# Patient Record
Sex: Female | Born: 1980 | Race: White | Hispanic: No | Marital: Married | Smoking: Never smoker
Health system: Southern US, Community
[De-identification: ages and names within clinical notes are randomized; demographics above are authoritative.]

## PROBLEM LIST (undated history)

## (undated) DIAGNOSIS — J309 Allergic rhinitis, unspecified: Secondary | ICD-10-CM

## (undated) HISTORY — DX: Allergic rhinitis, unspecified: J30.9

## (undated) HISTORY — PX: TONSILLECTOMY: SUR1361

## (undated) HISTORY — PX: PARTIAL HYSTERECTOMY: SHX80

## (undated) HISTORY — PX: CHOLECYSTECTOMY: SHX55

---

## 2013-02-10 DIAGNOSIS — G5600 Carpal tunnel syndrome, unspecified upper limb: Secondary | ICD-10-CM | POA: Insufficient documentation

## 2013-08-31 DIAGNOSIS — J3501 Chronic tonsillitis: Secondary | ICD-10-CM | POA: Insufficient documentation

## 2015-08-28 DIAGNOSIS — B070 Plantar wart: Secondary | ICD-10-CM | POA: Insufficient documentation

## 2016-04-20 DIAGNOSIS — S62639A Displaced fracture of distal phalanx of unspecified finger, initial encounter for closed fracture: Secondary | ICD-10-CM | POA: Insufficient documentation

## 2016-09-16 DIAGNOSIS — J3089 Other allergic rhinitis: Secondary | ICD-10-CM | POA: Insufficient documentation

## 2016-09-16 DIAGNOSIS — H04129 Dry eye syndrome of unspecified lacrimal gland: Secondary | ICD-10-CM | POA: Insufficient documentation

## 2017-03-24 ENCOUNTER — Ambulatory Visit: Payer: PRIVATE HEALTH INSURANCE | Admitting: Allergy and Immunology

## 2017-03-24 ENCOUNTER — Encounter: Payer: Self-pay | Admitting: Allergy and Immunology

## 2017-03-24 VITALS — BP 122/86 | HR 86 | Temp 98.8°F | Resp 16 | Ht 63.0 in | Wt 138.6 lb

## 2017-03-24 DIAGNOSIS — J4530 Mild persistent asthma, uncomplicated: Secondary | ICD-10-CM

## 2017-03-24 DIAGNOSIS — H1013 Acute atopic conjunctivitis, bilateral: Secondary | ICD-10-CM

## 2017-03-24 DIAGNOSIS — H1010 Acute atopic conjunctivitis, unspecified eye: Secondary | ICD-10-CM | POA: Insufficient documentation

## 2017-03-24 DIAGNOSIS — J3089 Other allergic rhinitis: Secondary | ICD-10-CM | POA: Diagnosis not present

## 2017-03-24 MED ORDER — CARBINOXAMINE MALEATE 6 MG PO TABS: 1.0000 | ORAL_TABLET | Freq: Four times a day (QID) | ORAL | 5 refills | Status: AC | PRN

## 2017-03-24 MED ORDER — MONTELUKAST SODIUM 10 MG PO TABS: ORAL_TABLET | ORAL | 5 refills | Status: AC

## 2017-03-24 MED ORDER — OLOPATADINE HCL 0.7 % OP SOLN: 1.0000 [drp] | Freq: Every day | OPHTHALMIC | 5 refills | Status: AC | PRN

## 2017-03-24 MED ORDER — FLUTICASONE PROPIONATE 93 MCG/ACT NA EXHU: 2.0000 | INHALANT_SUSPENSION | Freq: Two times a day (BID) | NASAL | 5 refills | Status: AC | PRN

## 2017-03-24 MED ORDER — ALBUTEROL SULFATE HFA 108 (90 BASE) MCG/ACT IN AERS: INHALATION_SPRAY | RESPIRATORY_TRACT | 2 refills | Status: DC

## 2017-03-24 NOTE — Assessment & Plan Note (Addendum)
   Aeroallergen avoidance measures have been discussed and provided in written form.  A prescription has been provided for RyVent (carbinoxamine maleate) 6mg every 6-8 hours as needed.  A prescription has been provided for Xhance, 2 actuations per nostril twice a day. Proper technique has been discussed and demonstrated.  Nasal saline spray (i.e., Simply Saline) or nasal saline lavage (i.e., NeilMed) is recommended as needed and prior to medicated nasal sprays.  If allergen avoidance measures and medications fail to adequately relieve symptoms, aeroallergen immunotherapy will be considered. 

## 2017-03-24 NOTE — Progress Notes (Signed)
New Patient Note  RE: Chloe Mcneil MRN: [id number] DOB: [birth year] Date of Office Visit: 03/24/2017  Referring provider: No ref. provider found Primary care provider: No primary care provider on file.  Chief Complaint: Allergic Rhinitis ; Cough; and Conjunctivitis   History of present illness: Chloe Mcneil is a 37 y.o. female presenting today for evaluation of allergic rhinitis and asthma.  She experiences nasal congestion, nasal pruritus, postnasal drainage, sneezing, ear canal pruritus, ocular pruritus, and occasional sinus pressure behind the eyes.  She reports that the symptoms occur year round but are most.  Taking cetirizine and montelukast "make it manageable", however she states "it could be better."  She also experiences dyspnea with mild exertion, such as climbing or descending 1 flight of stairs.  She wheezes with respiratory tract infections.  She was told that she had exercise-induced asthma she was in high school.  Complains of a nonproductive cough.  The cough is a little bit worse in the morning.  She denies heartburn and water brash.   Assessment and plan: Perennial and seasonal allergic rhinitis  Aeroallergen avoidance measures have been discussed and provided in written form.  A prescription has been provided for RyVent (carbinoxamine maleate) 6mg  every 6-8 hours as needed.    A prescription has been provided for [facility], 2 actuations per nostril twice a day. Proper technique has been discussed and demonstrated.  Nasal saline spray (i.e., Simply Saline) or nasal saline lavage (i.e., NeilMed) is recommended as needed and prior to medicated nasal sprays.  If allergen avoidance measures and medications fail to adequately relieve symptoms, aeroallergen immunotherapy will be considered.  Allergic conjunctivitis  Treatment plan as outlined above for allergic rhinitis.  A prescription has been provided for Pazeo, one drop per eye daily as needed.  I have  also recommended eye lubricant drops (i.e., Natural Tears) as needed.  Mild persistent asthma Todays spirometry results, assessed while asymptomatic, suggest under-perception of bronchoconstriction.  A prescription has been provided for montelukast 10 mg daily at bedtime.  A prescription has been provided for albuterol HFA, 1-2 inhalations every 4-6 hours if needed and 15 minutes prior to exercise.  Subjective and objective measures of pulmonary function will be followed and the treatment plan will be adjusted accordingly.   Meds ordered this encounter  Medications  . Carbinoxamine Maleate (RYVENT) 6 MG TABS    Sig: Take 1 tablet by mouth every 6 (six) hours as needed (May take every 8 hours as needed).    Dispense:  30 tablet    Refill:  5    Run as cash pay.  Patient has coupon.  . Fluticasone Propionate (XHANCE) 93 MCG/ACT EXHU    Sig: Place 2 sprays into the nose 2 (two) times daily as needed.    Dispense:  16 mL    Refill:  5  . Olopatadine HCl (PAZEO) 0.7 % SOLN    Sig: Place 1 drop into both eyes daily as needed (for itchy eyes.).    Dispense:  1 Bottle    Refill:  5  . montelukast (SINGULAIR) 10 MG tablet    Sig: One tablet at bedtime.    Dispense:  30 tablet    Refill:  5  . albuterol (PROVENTIL HFA;VENTOLIN HFA) 108 (90 Base) MCG/ACT inhaler    Sig: 1-2 puffs every 4-6 hours if needed for cough or wheeze.  May use 15 minutes prior to exercise.    Dispense:  1 Inhaler    Refill:  2  Diagnostics: Spirometry: FVC was 3.50 L and FEV1 was 2.70 L (3.01 L predicted) with 240 mL postbronchodilator improvement.  This study was performed while the patient was asymptomatic.  Please see scanned spirometry results for details. Epicutaneous testing: Positive to grass pollen, tree pollen, molds, cat hair, and horse epithelia. Intradermal testing: Positive to ragweed pollen, weed pollen, and dog epithelia.   Physical examination: Blood pressure 122/86, pulse 86, temperature  98.8 F (37.1 C), temperature source Oral, resp. rate 16, height 5\' 3"  (1.6 m), weight 138 lb 9.6 oz (62.9 kg), SpO2 98 %.  General: Alert, interactive, in no acute distress. HEENT: TMs pearly gray, turbinates moderately edematous with clear discharge, post-pharynx erythematous. Neck: Supple without lymphadenopathy. Lungs: Clear to auscultation without wheezing, rhonchi or rales. CV: Normal S1, S2 without murmurs. Abdomen: Nondistended, nontender. Skin: Warm and dry, without lesions or rashes. Extremities:  No clubbing, cyanosis or edema. Neuro:   Grossly intact.  Review of systems:  Review of systems negative except as noted in HPI / PMHx or noted below: Review of Systems  Constitutional: Negative.   HENT: Negative.   Eyes: Negative.   Respiratory: Negative.   Cardiovascular: Negative.   Gastrointestinal: Negative.   Genitourinary: Negative.   Musculoskeletal: Negative.   Skin: Negative.   Neurological: Negative.   Endo/Heme/Allergies: Negative.   Psychiatric/Behavioral: Negative.     Past medical history:  Past Medical History:  Diagnosis Date  . Allergic rhinitis     Past surgical history:  Past Surgical History:  Procedure Laterality Date  . CHOLECYSTECTOMY    . PARTIAL HYSTERECTOMY    . TONSILLECTOMY      Family history: Family History  Problem Relation Age of Onset  . Asthma Father   . Allergic rhinitis Father   . Food Allergy Father        tomato allergy  . Angioedema Neg Hx   . Eczema Neg Hx   . Immunodeficiency Neg Hx   . Urticaria Neg Hx     Social history: Social History   Socioeconomic History  . Marital status: Married    Spouse name: Not on file  . Number of children: Not on file  . Years of education: Not on file  . Highest education level: Not on file  Social Needs  . Financial resource strain: Not on file  . Food insecurity - worry: Not on file  . Food insecurity - inability: Not on file  . Transportation needs - medical: Not on  file  . Transportation needs - non-medical: Not on file  Occupational History  . Not on file  Tobacco Use  . Smoking status: Never Smoker  . Smokeless tobacco: Never Used  Substance and Sexual Activity  . Alcohol use: No    Frequency: Never  . Drug use: No  . Sexual activity: Yes  Other Topics Concern  . Not on file  Social History Narrative  . Not on file   Environmental History: The patient lives in a 36 year old house with hardwood floors throughout and central air/heat.  There is a dog in the house which has access to her bedroom.  There is no known mold/water damage in the home.  She is a non-smoker.  Allergies as of 03/24/2017      Reactions   Other Anaphylaxis   Grass, trees, outside items Grass, trees, outside items      Medication List        Accurate as of 03/24/17  5:10 PM. Always use your most recent  med list.          albuterol 108 (90 Base) MCG/ACT inhaler Commonly known as:  PROVENTIL HFA;VENTOLIN HFA 1-2 puffs every 4-6 hours if needed for cough or wheeze.  May use 15 minutes prior to exercise.   Carbinoxamine Maleate 6 MG Tabs Commonly known as:  RYVENT Take 1 tablet by mouth every 6 (six) hours as needed (May take every 8 hours as needed).   cetirizine 10 MG tablet Commonly known as:  ZYRTEC Take by mouth.   EPINEPHrine 0.3 mg/0.3 mL Soaj injection Commonly known as:  EPI-PEN Inject into the muscle.   Fluticasone Propionate 93 MCG/ACT Exhu Commonly known as:  XHANCE Place 2 sprays into the nose 2 (two) times daily as needed.   montelukast 10 MG tablet Commonly known as:  SINGULAIR One tablet at bedtime.   Olopatadine HCl 0.7 % Soln Commonly known as:  PAZEO Place 1 drop into both eyes daily as needed (for itchy eyes.).   Polyethyl Glycol-Propyl Glycol 0.4-0.3 % Gel ophthalmic gel Commonly known as:  SYSTANE Apply to eye.   promethazine-dextromethorphan 6.25-15 MG/5ML syrup Commonly known as:  PROMETHAZINE-DM Take by mouth.   XIIDRA  5 % Soln Generic drug:  Lifitegrast INSTILL ONE DROP INTO EACH EYE BID       Known medication allergies: Allergies  Allergen Reactions  . Other Anaphylaxis    Grass, trees, outside items Grass, trees, outside items     I appreciate the opportunity to take part in Sharetha's care. Please do not hesitate to contact me with questions.  Sincerely,   R. Jorene Guest, MD

## 2017-03-24 NOTE — Assessment & Plan Note (Signed)
   Treatment plan as outlined above for allergic rhinitis.  A prescription has been provided for Pazeo, one drop per eye daily as needed.  I have also recommended eye lubricant drops (i.e., Natural Tears) as needed. 

## 2017-03-24 NOTE — Patient Instructions (Addendum)
Perennial and seasonal allergic rhinitis  Aeroallergen avoidance measures have been discussed and provided in written form.  A prescription has been provided for RyVent (carbinoxamine maleate) 6mg  every 6-8 hours as needed.    A prescription has been provided for [facility], 2 actuations per nostril twice a day. Proper technique has been discussed and demonstrated.  Nasal saline spray (i.e., Simply Saline) or nasal saline lavage (i.e., NeilMed) is recommended as needed and prior to medicated nasal sprays.  If allergen avoidance measures and medications fail to adequately relieve symptoms, aeroallergen immunotherapy will be considered.  Allergic conjunctivitis  Treatment plan as outlined above for allergic rhinitis.  A prescription has been provided for Pazeo, one drop per eye daily as needed.  I have also recommended eye lubricant drops (i.e., Natural Tears) as needed.  Mild persistent asthma Todays spirometry results, assessed while asymptomatic, suggest under-perception of bronchoconstriction.  A prescription has been provided for montelukast 10 mg daily at bedtime.  A prescription has been provided for albuterol HFA, 1-2 inhalations every 4-6 hours if needed and 15 minutes prior to exercise.  Subjective and objective measures of pulmonary function will be followed and the treatment plan will be adjusted accordingly.   Return in about 2 months (around 05/22/2017), or if symptoms worsen or fail to improve.  Reducing Pollen Exposure  The American Academy of Allergy, Asthma and Immunology suggests the following steps to reduce your exposure to pollen during allergy seasons.    1. Do not hang sheets or clothing out to dry; pollen may collect on these items. 2. Do not mow lawns or spend time around freshly cut grass; mowing stirs up pollen. 3. Keep windows closed at night.  Keep car windows closed while driving. 4. Minimize morning activities outdoors, a time when pollen counts are  usually at their highest. 5. Stay indoors as much as possible when pollen counts or humidity is high and on windy days when pollen tends to remain in the air longer. 6. Use air conditioning when possible.  Many air conditioners have filters that trap the pollen spores. 7. Use a HEPA room air filter to remove pollen form the indoor air you breathe.   Control of Mold Allergen  Mold and fungi can grow on a variety of surfaces provided certain temperature and moisture conditions exist.  Outdoor molds grow on plants, decaying vegetation and soil.  The major outdoor mold, Alternaria and Cladosporium, are found in very high numbers during hot and dry conditions.  Generally, a late Summer - Fall peak is seen for common outdoor fungal spores.  Rain will temporarily lower outdoor mold spore count, but counts rise rapidly when the rainy period ends.  The most important indoor molds are Aspergillus and Penicillium.  Dark, humid and poorly ventilated basements are ideal sites for mold growth.  The next most common sites of mold growth are the bathroom and the kitchen.  Outdoor [employer] 1. Use air conditioning and keep windows closed 2. Avoid exposure to decaying vegetation. 3. Avoid leaf raking. 4. Avoid grain handling. 5. Consider wearing a face mask if working in moldy areas.  Indoor Mold Control 1. Maintain humidity below 50%. 2. Clean washable surfaces with 5% bleach solution. 3. Remove sources e.g. Contaminated carpets.  Control of Dog or Cat Allergen  Avoidance is the best way to manage a dog or cat allergy. If you have a dog or cat and are allergic to dog or cats, consider removing the dog or cat from the home. If  you have a dog or cat but don't want to find it a new home, or if your family wants a pet even though someone in the household is allergic, here are some strategies that may help keep symptoms at bay:  1. Keep the pet out of your bedroom and restrict it to only a few rooms. Be  advised that keeping the dog or cat in only one room will not limit the allergens to that room. 2. Don't pet, hug or kiss the dog or cat; if you do, wash your hands with soap and water. 3. High-efficiency particulate air (HEPA) cleaners run continuously in a bedroom or living room can reduce allergen levels over time. 4. Place electrostatic material sheet in the air inlet vent in the bedroom. 5. Regular use of a high-efficiency vacuum cleaner or a central vacuum can reduce allergen levels. 6. Giving your dog or cat a bath at least once a week can reduce airborne allergen.

## 2017-03-24 NOTE — Assessment & Plan Note (Addendum)
Todays spirometry results, assessed while asymptomatic, suggest under-perception of bronchoconstriction.  A prescription has been provided for montelukast 10 mg daily at bedtime.  A prescription has been provided for albuterol HFA, 1-2 inhalations every 4-6 hours if needed and 15 minutes prior to exercise.  Subjective and objective measures of pulmonary function will be followed and the treatment plan will be adjusted accordingly.

## 2017-05-26 ENCOUNTER — Ambulatory Visit: Payer: PRIVATE HEALTH INSURANCE | Admitting: Allergy and Immunology

## 2017-11-25 ENCOUNTER — Emergency Department (INDEPENDENT_AMBULATORY_CARE_PROVIDER_SITE_OTHER)
Admission: EM | Admit: 2017-11-25 | Discharge: 2017-11-25 | Disposition: A | Discharge status: Home / Self Care | Payer: PRIVATE HEALTH INSURANCE | Source: Home / Self Care | Attending: Family Medicine | Admitting: Family Medicine

## 2017-11-25 ENCOUNTER — Emergency Department (INDEPENDENT_AMBULATORY_CARE_PROVIDER_SITE_OTHER): Payer: PRIVATE HEALTH INSURANCE

## 2017-11-25 ENCOUNTER — Encounter: Payer: Self-pay | Admitting: Emergency Medicine

## 2017-11-25 ENCOUNTER — Other Ambulatory Visit: Payer: Self-pay

## 2017-11-25 DIAGNOSIS — J208 Acute bronchitis due to other specified organisms: Secondary | ICD-10-CM

## 2017-11-25 DIAGNOSIS — R05 Cough: Secondary | ICD-10-CM | POA: Diagnosis not present

## 2017-11-25 DIAGNOSIS — B9689 Other specified bacterial agents as the cause of diseases classified elsewhere: Secondary | ICD-10-CM

## 2017-11-25 DIAGNOSIS — J4520 Mild intermittent asthma, uncomplicated: Secondary | ICD-10-CM

## 2017-11-25 MED ORDER — PREDNISONE 20 MG PO TABS: ORAL_TABLET | ORAL | 0 refills | Status: AC

## 2017-11-25 MED ORDER — ALBUTEROL SULFATE HFA 108 (90 BASE) MCG/ACT IN AERS: 1.0000 | INHALATION_SPRAY | Freq: Four times a day (QID) | RESPIRATORY_TRACT | 0 refills | Status: AC | PRN

## 2017-11-25 MED ORDER — FLUCONAZOLE 150 MG PO TABS: 150.0000 mg | ORAL_TABLET | Freq: Once | ORAL | 1 refills | Status: AC

## 2017-11-25 MED ORDER — AZITHROMYCIN 250 MG PO TABS: 250.0000 mg | ORAL_TABLET | Freq: Every day | ORAL | 0 refills | Status: AC

## 2017-11-25 NOTE — ED Provider Notes (Signed)
[facility]    CSN: [id number] Arrival date & time: 11/25/17  9528     History   Chief Complaint Chief Complaint  Patient presents with  . Cough    HPI Lavene Penagos is a 37 y.o. female.   HPI  Manisha Cancel is a 37 y.o. female presenting to UC with c/o productive cough for 6 weeks. She is coughing up thick white mucous. She has hx of seasonal allergies and does take her Claritin but this cough is more intense and persistent. Hx of mild asthma and has had an inhaler in the past but does not currently have an inhaler. She has tried mucinex but it makes her jittery and does not seem to help with the cough. Denies fever, chills, n/v/d.    Past Medical History:  Diagnosis Date  . Allergic rhinitis     Patient Active Problem List   Diagnosis Date Noted  . Allergic conjunctivitis 03/24/2017  . Mild persistent asthma 03/24/2017  . Perennial and seasonal allergic rhinitis 09/16/2016  . 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 Closed fracture of tuft of distal phalanx of finger 04/20/2016  . Plantar wart 08/28/2015  . Chronic tonsillitis 08/31/2013  . CTS (carpal tunnel syndrome) 02/10/2013    Past Surgical History:  Procedure Laterality Date  . CHOLECYSTECTOMY    . PARTIAL HYSTERECTOMY    . TONSILLECTOMY      OB History   None      Home Medications    Prior to Admission medications   Medication Sig Start Date End Date Taking? Authorizing Provider  guaiFENesin (MUCINEX) 600 MG 12 hr tablet Take by mouth 2 (two) times daily.   Yes [provider]  albuterol (PROVENTIL HFA;VENTOLIN HFA) 108 (90 Base) MCG/ACT inhaler Inhale 1-2 puffs into the lungs every 6 (six) hours as needed for wheezing or shortness of breath. 11/25/17   Lurene Shadow, PA-C  azithromycin (ZITHROMAX) 250 MG tablet Take 1 tablet (250 mg total) by mouth daily. Take first 2 tablets together, then 1 every day until finished. 11/25/17   Lurene Shadow, PA-C    Carbinoxamine Maleate (RYVENT) 6 MG TABS Take 1 tablet by mouth every 6 (six) hours as needed (May take every 8 hours as needed). 03/24/17   Bobbitt, Heywood Iles, MD  cetirizine (ZYRTEC) 10 MG tablet Take by mouth. 09/28/16   [provider]  EPINEPHrine 0.3 mg/0.3 mL IJ SOAJ injection Inject into the muscle. 07/01/15   [provider]  fluconazole (DIFLUCAN) 150 MG tablet Take 1 tablet (150 mg total) by mouth once for 1 dose. 11/25/17 11/25/17  Lurene Shadow, PA-C  Fluticasone Propionate Timmothy Sours) 93 MCG/ACT EXHU Place 2 sprays into the nose 2 (two) times daily as needed. 03/24/17   Bobbitt, Heywood Iles, MD  montelukast (SINGULAIR) 10 MG tablet One tablet at bedtime. 03/24/17   Bobbitt, Heywood Iles, MD  Olopatadine HCl (PAZEO) 0.7 % SOLN Place 1 drop into both eyes daily as needed (for itchy eyes.). 03/24/17   Bobbitt, Heywood Iles, MD  Polyethyl Glycol-Propyl Glycol (SYSTANE) 0.4-0.3 % GEL ophthalmic gel Apply to eye.    [provider]  predniSONE (DELTASONE) 20 MG tablet 3 tabs po day one, then 2 po daily x 4 days 11/25/17   Lurene Shadow, PA-C  promethazine-dextromethorphan (PROMETHAZINE-DM) 6.25-15 MG/5ML syrup Take by mouth. 02/18/17   [provider]  Benay Spice 5 % SOLN INSTILL ONE DROP INTO EACH EYE BID 03/22/17   [provider]    Family History Family History  Problem Relation Age of Onset  . Asthma Father   . Allergic rhinitis Father   . Food Allergy Father        tomato allergy  . Angioedema Neg Hx   . Eczema Neg Hx   . Immunodeficiency Neg Hx   . Urticaria Neg Hx     Social History Social History   Tobacco Use  . Smoking status: Never Smoker  . Smokeless tobacco: Never Used  Substance Use Topics  . Alcohol use: No    Frequency: Never  . Drug use: No     Allergies   Other   Review of Systems Review of Systems  Constitutional: Negative for chills and fever.  HENT: Positive for congestion and postnasal drip. Negative for  ear pain, sore throat, trouble swallowing and voice change.   Respiratory: Positive for cough. Negative for shortness of breath.   Cardiovascular: Negative for chest pain and palpitations.  Gastrointestinal: Negative for abdominal pain, diarrhea, nausea and vomiting.  Musculoskeletal: Negative for arthralgias, back pain and myalgias.  Skin: Negative for rash.     Physical Exam Triage Vital Signs ED Triage Vitals  Enc Vitals Group     BP 11/25/17 0918 135/90     Pulse Rate 11/25/17 0918 99     Resp --      Temp 11/25/17 0918 97.9 F (36.6 C)     Temp Source 11/25/17 0918 Oral     SpO2 11/25/17 0918 99 %     Weight 11/25/17 0919 135 lb (61.2 kg)     Height 11/25/17 0919 5\' 3"  (1.6 m)     Head Circumference --      Peak Flow --      Pain Score 11/25/17 0918 0     Pain Loc --      Pain Edu? --      Excl. in GC? --    No data found.  Updated Vital Signs BP 135/90 (BP Location: Right Arm)   Pulse 99   Temp 97.9 F (36.6 C) (Oral)   Ht 5\' 3"  (1.6 m)   Wt 135 lb (61.2 kg)   SpO2 99%   BMI 23.91 kg/m   Visual Acuity Right Eye Distance:   Left Eye Distance:   Bilateral Distance:    Right Eye Near:   Left Eye Near:    Bilateral Near:     Physical Exam  Constitutional: She is oriented to person, place, and time. She appears well-developed and well-nourished. No distress.  HENT:  Head: Normocephalic and atraumatic.  Right Ear: Tympanic membrane normal.  Left Ear: Tympanic membrane normal.  Nose: Nose normal. Right sinus exhibits no maxillary sinus tenderness and no frontal sinus tenderness. Left sinus exhibits no maxillary sinus tenderness and no frontal sinus tenderness.  Mouth/Throat: Uvula is midline, oropharynx is clear and moist and mucous membranes are normal.  Eyes: EOM are normal.  Neck: Normal range of motion. Neck supple.  Cardiovascular: Normal rate and regular rhythm.  Pulmonary/Chest: Effort normal and breath sounds normal. No stridor. No respiratory  distress. She has no wheezes. She has no rales.  Mildly productive cough during lung exam. Rhonchi clear with cough.   Musculoskeletal: Normal range of motion.  Lymphadenopathy:    She has no cervical adenopathy.  Neurological: She is alert and oriented to person, place, and time.  Skin: Skin is warm and dry. She is not diaphoretic.  Psychiatric: She has a normal  mood and affect. Her behavior is normal.  Nursing note and vitals reviewed.    UC Treatments / Results  Labs (all labs ordered are listed, but only abnormal results are displayed) Labs Reviewed - No data to display  EKG None  Radiology Dg Chest 2 View  Result Date: 11/25/2017 CLINICAL DATA:  Six weeks of productive cough.  Nonsmoker. EXAM: CHEST - 2 VIEW COMPARISON:  None. FINDINGS: The lungs are mildly hyperinflated and clear. The heart and pulmonary vascularity are normal. The mediastinum is normal in width. There is no pleural effusion. The bony thorax is unremarkable. IMPRESSION: Mild hyperinflation may be voluntary or may reflect reactive airway disease or acute bronchitis. There is no pneumonia nor CHF. Electronically Signed   By: David  [place] M.D.   On: 11/25/2017 09:49    Procedures Procedures (including critical care time)  Medications Ordered in UC Medications - No data to display  Initial Impression / Assessment and Plan / UC Course  I have reviewed the triage vital signs and the nursing notes.  Pertinent labs & imaging results that were available during my care of the patient were reviewed by me and considered in my medical decision making (see chart for details).     Discussed imaging with pt. Due to duration of symptoms, will cover for atypical bacteria with Azithromycin Home care instructions provided.  Final Clinical Impressions(s) / UC Diagnoses   Final diagnoses:  Acute bacterial bronchitis  Mild intermittent reactive airway disease without complication     Discharge Instructions       Please take your medications as prescribed and follow up with family medicine in 1 week as needed.     ED Prescriptions    Medication Sig Dispense Auth. Provider   predniSONE (DELTASONE) 20 MG tablet 3 tabs po day one, then 2 po daily x 4 days 11 tablet Emitt Maglione O, PA-C   azithromycin (ZITHROMAX) 250 MG tablet Take 1 tablet (250 mg total) by mouth daily. Take first 2 tablets together, then 1 every day until finished. 6 tablet Doroteo Glassman, Zephan Beauchaine O, PA-C   albuterol (PROVENTIL HFA;VENTOLIN HFA) 108 (90 Base) MCG/ACT inhaler Inhale 1-2 puffs into the lungs every 6 (six) hours as needed for wheezing or shortness of breath. 1 Inhaler Doroteo Glassman, Chenoah Mcnally O, PA-C   fluconazole (DIFLUCAN) 150 MG tablet Take 1 tablet (150 mg total) by mouth once for 1 dose. 1 tablet Lurene Shadow, PA-C     Controlled Substance Prescriptions [place] Controlled Substance Registry consulted? Not Applicable   Rolla Plate 11/25/17 1040

## 2017-11-25 NOTE — ED Triage Notes (Signed)
Productive cough x 6 weeks. Thick white mucus

## 2017-11-25 NOTE — Discharge Instructions (Signed)
°  Please take your medications as prescribed and follow up with family medicine in 1 week as needed.

## 2019-09-02 IMAGING — DX DG CHEST 2V
2 series · 2 of 2 positions shown · non-contrast
Comparison: None.

CLINICAL DATA: Six weeks of productive cough.  Nonsmoker.

EXAM:
CHEST - 2 VIEW

[chest pa]
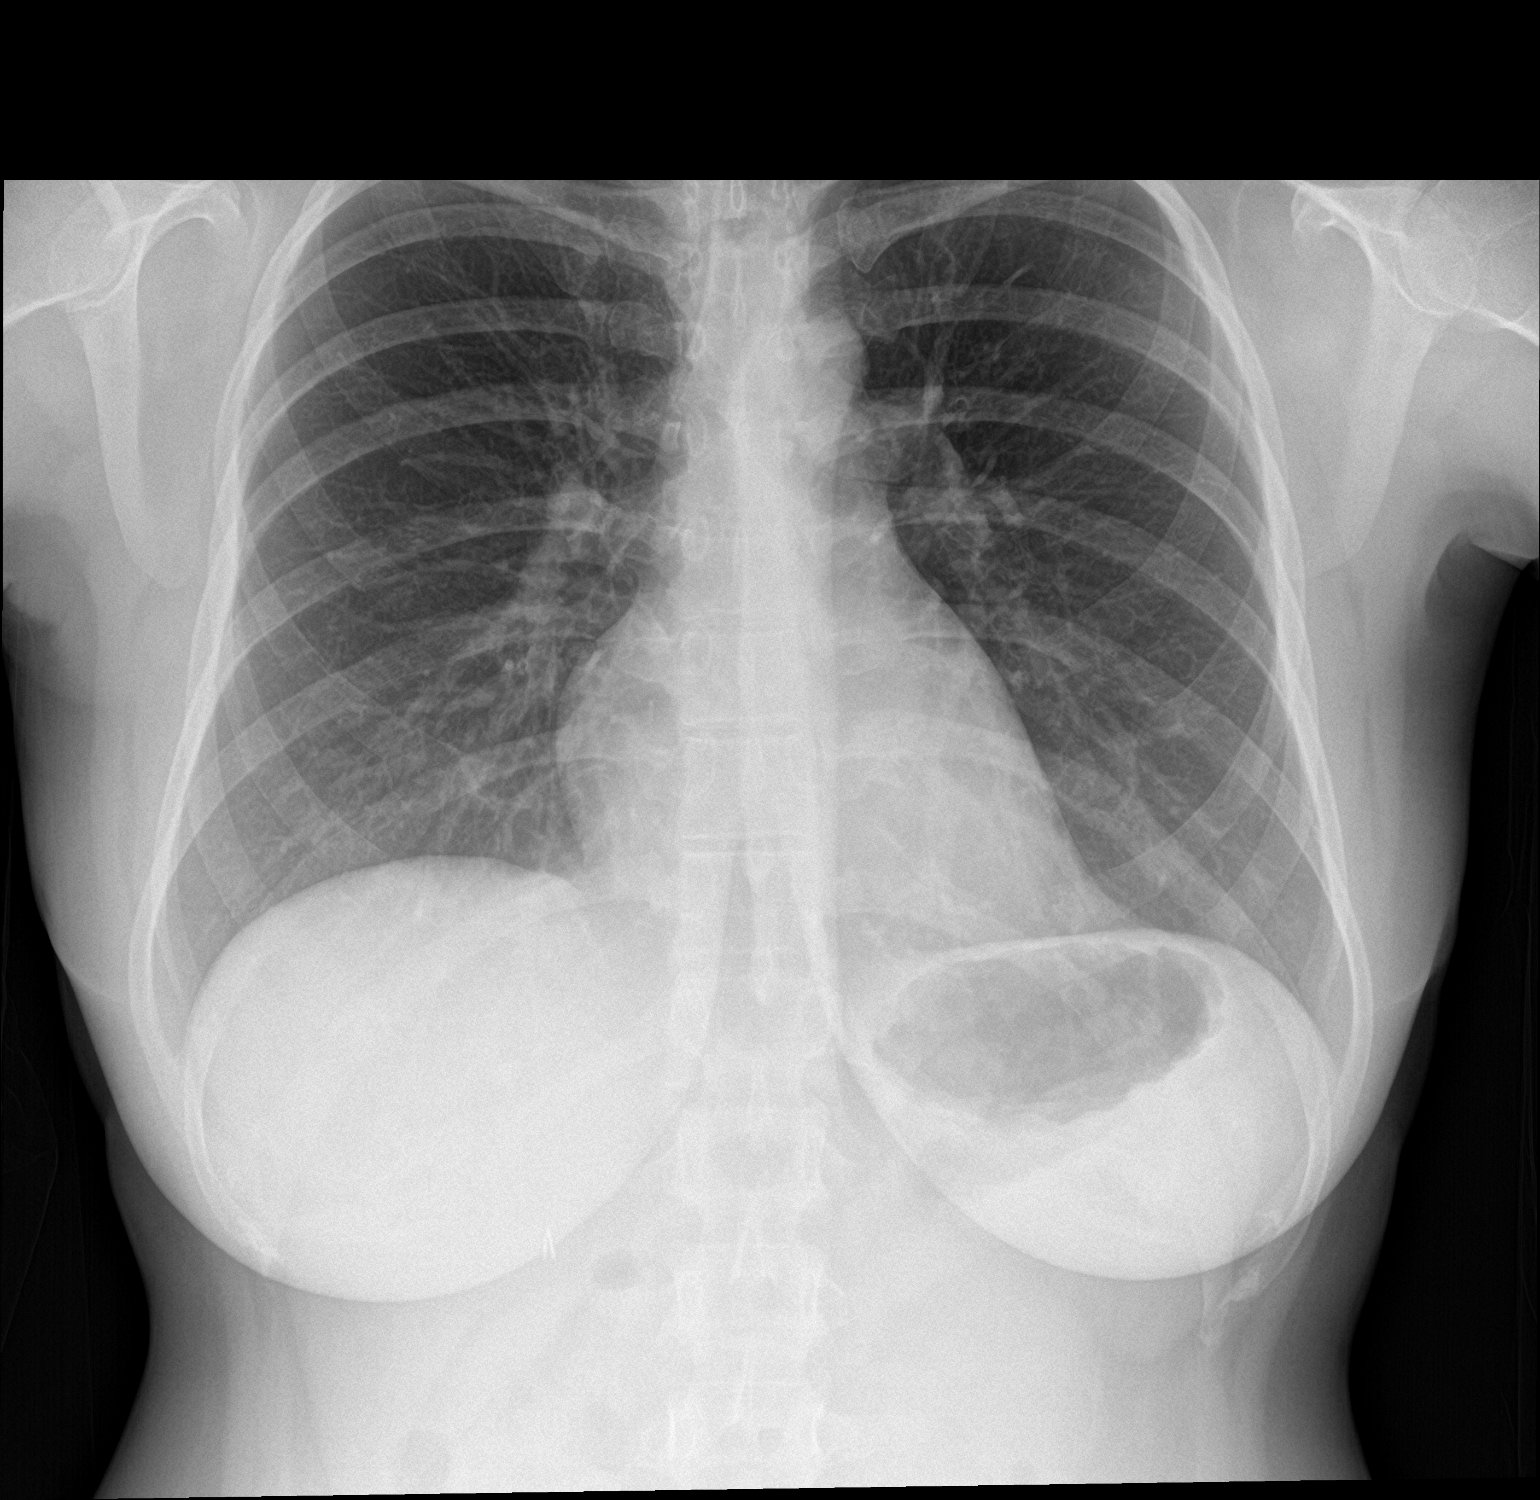

[chest lat]
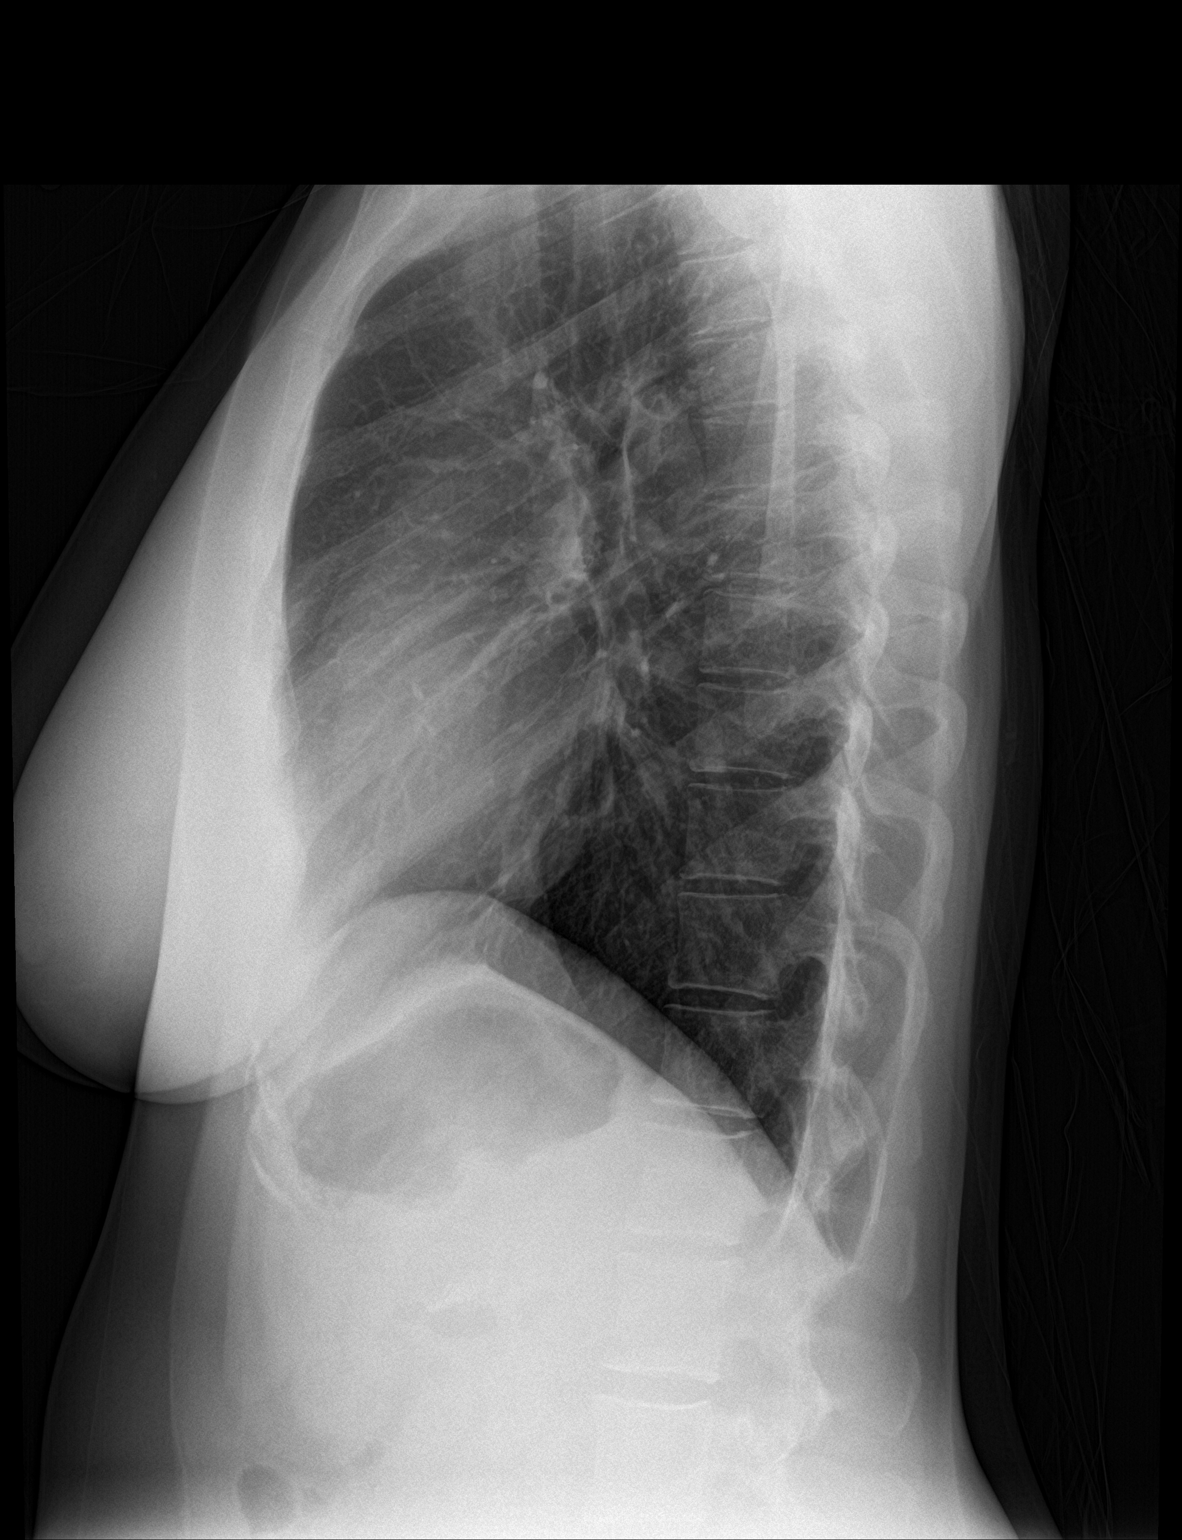

[2 of 2 positions shown; findings below may reference images not displayed]

FINDINGS: The lungs are mildly hyperinflated and clear. The heart and
pulmonary vascularity are normal. The mediastinum is normal in
width. There is no pleural effusion. The bony thorax is
unremarkable.
IMPRESSION: Mild hyperinflation may be voluntary or may reflect reactive airway
disease or acute bronchitis. There is no pneumonia nor CHF.
